# Patient Record
Sex: Female | Born: 1970 | Race: White | Hispanic: No | Marital: Married | Smoking: Former smoker
Health system: Southern US, Community
[De-identification: ages and names within clinical notes are randomized; demographics above are authoritative.]

## PROBLEM LIST (undated history)

## (undated) DIAGNOSIS — C801 Malignant (primary) neoplasm, unspecified: Secondary | ICD-10-CM

## (undated) DIAGNOSIS — Z1211 Encounter for screening for malignant neoplasm of colon: Secondary | ICD-10-CM

## (undated) HISTORY — PX: OTHER SURGICAL HISTORY: SHX169

## (undated) HISTORY — PX: SEPTOPLASTY: SUR1290

## (undated) HISTORY — DX: Malignant (primary) neoplasm, unspecified: C80.1

## (undated) HISTORY — DX: Encounter for screening for malignant neoplasm of colon: Z12.11

---

## 1997-11-17 ENCOUNTER — Inpatient Hospital Stay (HOSPITAL_COMMUNITY): Admission: AD | Admit: 1997-11-17 | Discharge: 1997-11-19 | Payer: Self-pay | Admitting: *Deleted

## 1997-12-29 ENCOUNTER — Other Ambulatory Visit: Admission: RE | Admit: 1997-12-29 | Discharge: 1997-12-29 | Payer: Self-pay | Admitting: *Deleted

## 2002-10-17 ENCOUNTER — Other Ambulatory Visit: Admission: RE | Admit: 2002-10-17 | Discharge: 2002-10-17 | Payer: Self-pay | Admitting: Obstetrics & Gynecology

## 2004-03-27 HISTORY — PX: BREAST BIOPSY: SHX20

## 2005-01-09 ENCOUNTER — Ambulatory Visit: Payer: Self-pay | Admitting: Family Medicine

## 2005-01-17 ENCOUNTER — Ambulatory Visit: Payer: Self-pay | Admitting: Oncology

## 2005-01-20 ENCOUNTER — Ambulatory Visit: Payer: Self-pay | Admitting: Oncology

## 2005-01-22 ENCOUNTER — Encounter: Admission: RE | Admit: 2005-01-22 | Discharge: 2005-01-22 | Payer: Self-pay | Admitting: Oncology

## 2005-01-25 ENCOUNTER — Ambulatory Visit: Payer: Self-pay | Admitting: Oncology

## 2005-02-24 ENCOUNTER — Ambulatory Visit: Payer: Self-pay | Admitting: Oncology

## 2005-03-27 ENCOUNTER — Ambulatory Visit: Payer: Self-pay | Admitting: Oncology

## 2005-03-27 DIAGNOSIS — C801 Malignant (primary) neoplasm, unspecified: Secondary | ICD-10-CM

## 2005-03-27 DIAGNOSIS — Z1211 Encounter for screening for malignant neoplasm of colon: Secondary | ICD-10-CM

## 2005-03-27 HISTORY — PX: COLONOSCOPY: SHX174

## 2005-03-27 HISTORY — DX: Malignant (primary) neoplasm, unspecified: C80.1

## 2005-03-27 HISTORY — DX: Encounter for screening for malignant neoplasm of colon: Z12.11

## 2005-03-27 HISTORY — PX: BREAST LUMPECTOMY: SHX2

## 2005-04-27 ENCOUNTER — Ambulatory Visit: Payer: Self-pay | Admitting: Oncology

## 2005-05-25 ENCOUNTER — Ambulatory Visit: Payer: Self-pay | Admitting: Oncology

## 2005-05-26 ENCOUNTER — Ambulatory Visit: Payer: Self-pay | Admitting: Oncology

## 2005-06-25 ENCOUNTER — Ambulatory Visit: Payer: Self-pay | Admitting: Oncology

## 2005-06-28 ENCOUNTER — Ambulatory Visit: Payer: Self-pay | Admitting: General Surgery

## 2005-07-25 ENCOUNTER — Ambulatory Visit: Payer: Self-pay | Admitting: Oncology

## 2005-08-25 ENCOUNTER — Ambulatory Visit: Payer: Self-pay | Admitting: Oncology

## 2005-09-24 ENCOUNTER — Ambulatory Visit: Payer: Self-pay | Admitting: Oncology

## 2005-10-25 ENCOUNTER — Ambulatory Visit: Payer: Self-pay | Admitting: Oncology

## 2006-01-01 ENCOUNTER — Ambulatory Visit: Payer: Self-pay | Admitting: General Surgery

## 2006-01-03 ENCOUNTER — Ambulatory Visit: Payer: Self-pay | Admitting: General Surgery

## 2006-02-09 ENCOUNTER — Ambulatory Visit: Payer: Self-pay | Admitting: Oncology

## 2006-02-24 ENCOUNTER — Ambulatory Visit: Payer: Self-pay | Admitting: Oncology

## 2006-03-12 ENCOUNTER — Ambulatory Visit: Payer: Self-pay | Admitting: General Surgery

## 2006-06-12 ENCOUNTER — Ambulatory Visit: Payer: Self-pay | Admitting: Oncology

## 2006-06-13 ENCOUNTER — Ambulatory Visit: Payer: Self-pay | Admitting: Oncology

## 2006-06-26 ENCOUNTER — Ambulatory Visit: Payer: Self-pay | Admitting: Oncology

## 2006-08-08 ENCOUNTER — Ambulatory Visit: Payer: Self-pay | Admitting: General Surgery

## 2006-09-19 ENCOUNTER — Ambulatory Visit: Payer: Self-pay | Admitting: Oncology

## 2006-09-25 ENCOUNTER — Ambulatory Visit: Payer: Self-pay | Admitting: Oncology

## 2006-11-01 ENCOUNTER — Ambulatory Visit: Payer: Self-pay | Admitting: Family Medicine

## 2006-12-26 ENCOUNTER — Ambulatory Visit: Payer: Self-pay | Admitting: Oncology

## 2007-01-02 ENCOUNTER — Ambulatory Visit: Payer: Self-pay | Admitting: General Surgery

## 2007-01-11 ENCOUNTER — Ambulatory Visit: Payer: Self-pay | Admitting: Oncology

## 2007-01-26 ENCOUNTER — Ambulatory Visit: Payer: Self-pay | Admitting: Oncology

## 2007-02-15 ENCOUNTER — Ambulatory Visit: Payer: Self-pay | Admitting: Oncology

## 2007-02-25 ENCOUNTER — Ambulatory Visit: Payer: Self-pay | Admitting: Oncology

## 2007-05-30 ENCOUNTER — Ambulatory Visit: Payer: Self-pay | Admitting: Oncology

## 2007-06-26 ENCOUNTER — Ambulatory Visit: Payer: Self-pay | Admitting: Oncology

## 2007-07-04 ENCOUNTER — Ambulatory Visit: Payer: Self-pay | Admitting: Oncology

## 2007-07-26 ENCOUNTER — Ambulatory Visit: Payer: Self-pay | Admitting: Oncology

## 2007-12-26 ENCOUNTER — Ambulatory Visit: Payer: Self-pay | Admitting: Oncology

## 2008-01-06 ENCOUNTER — Ambulatory Visit: Payer: Self-pay | Admitting: General Surgery

## 2008-05-21 ENCOUNTER — Ambulatory Visit: Payer: Self-pay | Admitting: Family Medicine

## 2009-04-29 ENCOUNTER — Ambulatory Visit: Payer: Self-pay | Admitting: General Surgery

## 2010-05-03 ENCOUNTER — Ambulatory Visit: Payer: Self-pay | Admitting: General Surgery

## 2010-05-05 ENCOUNTER — Ambulatory Visit: Payer: Self-pay | Admitting: General Surgery

## 2010-05-20 ENCOUNTER — Other Ambulatory Visit: Payer: Self-pay | Admitting: *Deleted

## 2010-05-20 DIAGNOSIS — Z9889 Other specified postprocedural states: Secondary | ICD-10-CM

## 2010-05-26 ENCOUNTER — Other Ambulatory Visit: Payer: Self-pay | Admitting: General Surgery

## 2010-05-26 DIAGNOSIS — Z9889 Other specified postprocedural states: Secondary | ICD-10-CM

## 2010-07-07 ENCOUNTER — Other Ambulatory Visit: Payer: Self-pay | Admitting: General Surgery

## 2010-07-07 DIAGNOSIS — Z9889 Other specified postprocedural states: Secondary | ICD-10-CM

## 2010-07-08 ENCOUNTER — Ambulatory Visit
Admission: RE | Admit: 2010-07-08 | Discharge: 2010-07-08 | Disposition: A | Discharge status: Home / Self Care | Payer: BC Managed Care – PPO | Source: Ambulatory Visit | Attending: *Deleted | Admitting: *Deleted

## 2010-07-08 DIAGNOSIS — Z9889 Other specified postprocedural states: Secondary | ICD-10-CM

## 2010-07-08 MED ORDER — GADOBENATE DIMEGLUMINE 529 MG/ML IV SOLN
14.0000 mL | Freq: Once | INTRAVENOUS | Status: AC | PRN
  Administered 2010-07-08: 14 mL via INTRAVENOUS

## 2011-05-03 ENCOUNTER — Ambulatory Visit: Payer: Self-pay | Admitting: Oncology

## 2011-05-16 LAB — CBC CANCER CENTER
Basophil #: 0 x10 3/mm (ref 0.0–0.1)
Basophil %: 0.6 %
Eosinophil #: 0 x10 3/mm (ref 0.0–0.7)
Eosinophil %: 1 %
HCT: 39.8 % (ref 35.0–47.0)
HGB: 13.6 g/dL (ref 12.0–16.0)
Lymphocyte #: 1.2 x10 3/mm (ref 1.0–3.6)
Lymphocyte %: 26.1 %
MCH: 30.5 pg (ref 26.0–34.0)
MCHC: 34.2 g/dL (ref 32.0–36.0)
MCV: 89 fL (ref 80–100)
Monocyte #: 0.4 x10 3/mm (ref 0.0–0.7)
Monocyte %: 8 %
Neutrophil #: 3.1 x10 3/mm (ref 1.4–6.5)
Neutrophil %: 64.3 %
Platelet: 283 x10 3/mm (ref 150–440)
RBC: 4.47 10*6/uL (ref 3.80–5.20)
RDW: 13.2 % (ref 11.5–14.5)
WBC: 4.8 x10 3/mm (ref 3.6–11.0)

## 2011-05-25 ENCOUNTER — Ambulatory Visit: Payer: Self-pay | Admitting: General Surgery

## 2011-05-26 ENCOUNTER — Ambulatory Visit: Payer: Self-pay | Admitting: Oncology

## 2011-11-09 ENCOUNTER — Ambulatory Visit: Payer: Self-pay | Admitting: Oncology

## 2011-11-10 LAB — CBC CANCER CENTER
Basophil #: 0 x10 3/mm (ref 0.0–0.1)
Basophil %: 0.5 %
Eosinophil #: 0 x10 3/mm (ref 0.0–0.7)
Eosinophil %: 0.6 %
HCT: 36.9 % (ref 35.0–47.0)
HGB: 12.2 g/dL (ref 12.0–16.0)
Lymphocyte #: 1.4 x10 3/mm (ref 1.0–3.6)
Lymphocyte %: 18.5 %
MCH: 30.1 pg (ref 26.0–34.0)
MCHC: 33 g/dL (ref 32.0–36.0)
MCV: 91 fL (ref 80–100)
Monocyte #: 0.5 x10 3/mm (ref 0.2–0.9)
Monocyte %: 7.2 %
Neutrophil %: 73.2 %
RDW: 13 % (ref 11.5–14.5)
WBC: 7.3 x10 3/mm (ref 3.6–11.0)

## 2011-11-26 ENCOUNTER — Ambulatory Visit: Payer: Self-pay | Admitting: Oncology

## 2012-05-05 ENCOUNTER — Encounter: Payer: Self-pay | Admitting: *Deleted

## 2012-05-05 DIAGNOSIS — Z1211 Encounter for screening for malignant neoplasm of colon: Secondary | ICD-10-CM | POA: Insufficient documentation

## 2012-05-05 DIAGNOSIS — C801 Malignant (primary) neoplasm, unspecified: Secondary | ICD-10-CM | POA: Insufficient documentation

## 2012-06-20 ENCOUNTER — Encounter: Payer: Self-pay | Admitting: *Deleted

## 2012-06-20 NOTE — Progress Notes (Signed)
Made in error

## 2012-11-08 IMAGING — MG MAM DGTL DIAGNOSTIC MAMMO W/CAD
1 series · 6 of 6 positions shown · non-contrast
Comparison: none

REASON FOR EXAM: HX BR CA  YRLY
COMMENTS:

[R CC · right · 6 of 6 slices shown]
[im 1/6]
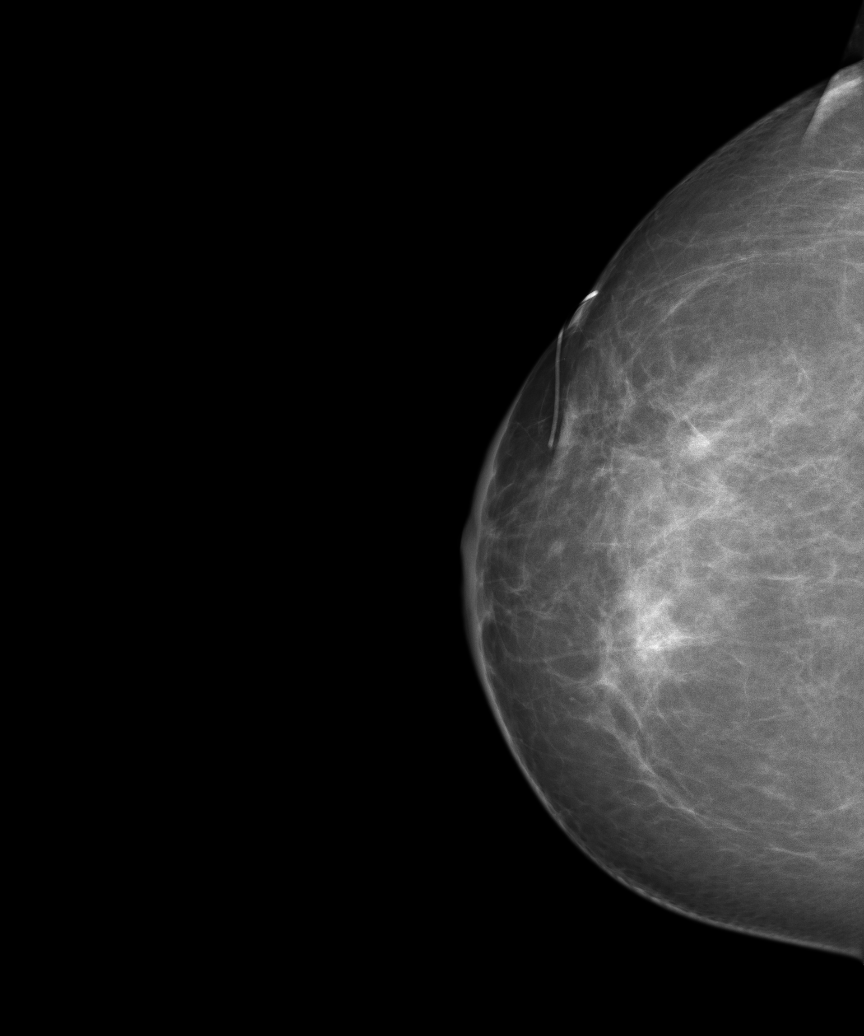
[im 2/6]
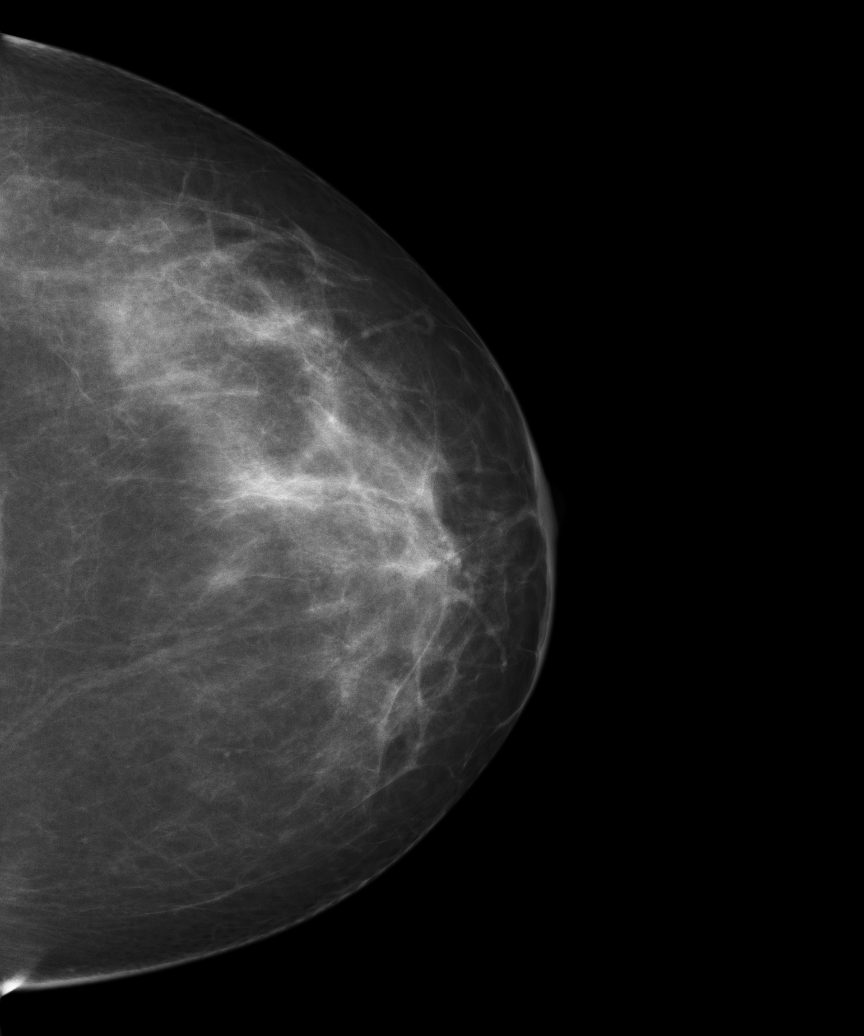
[im 3/6]
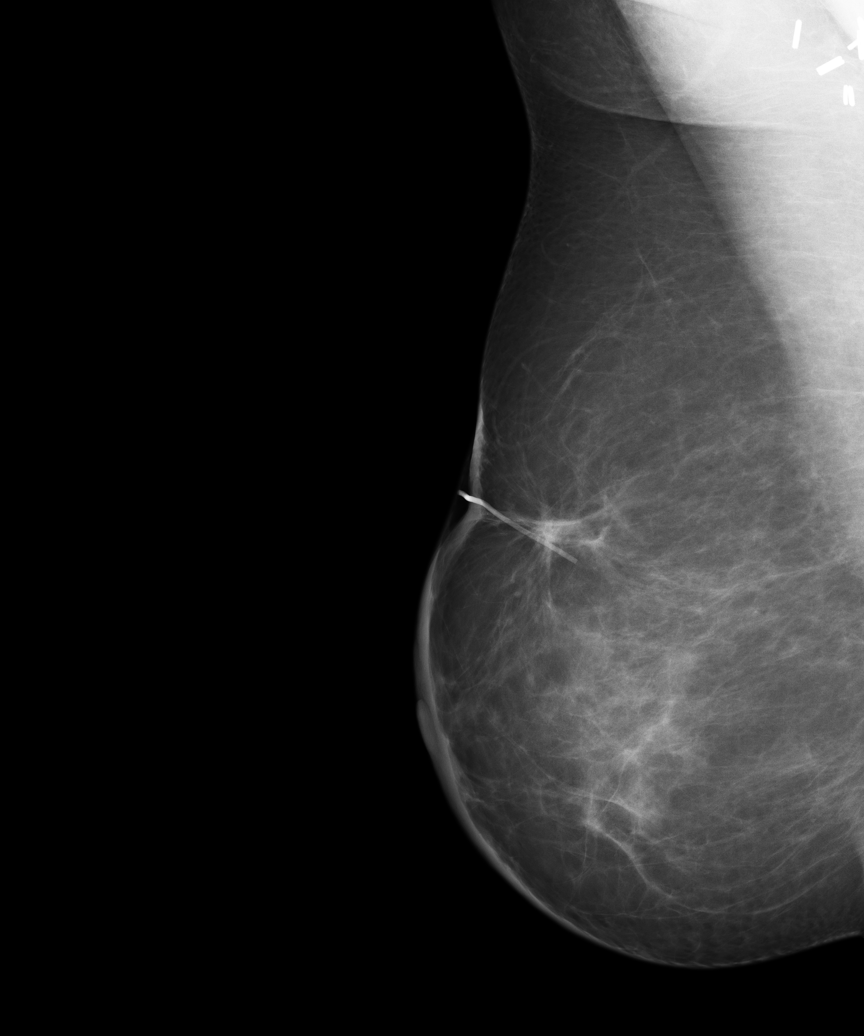
[im 4/6]
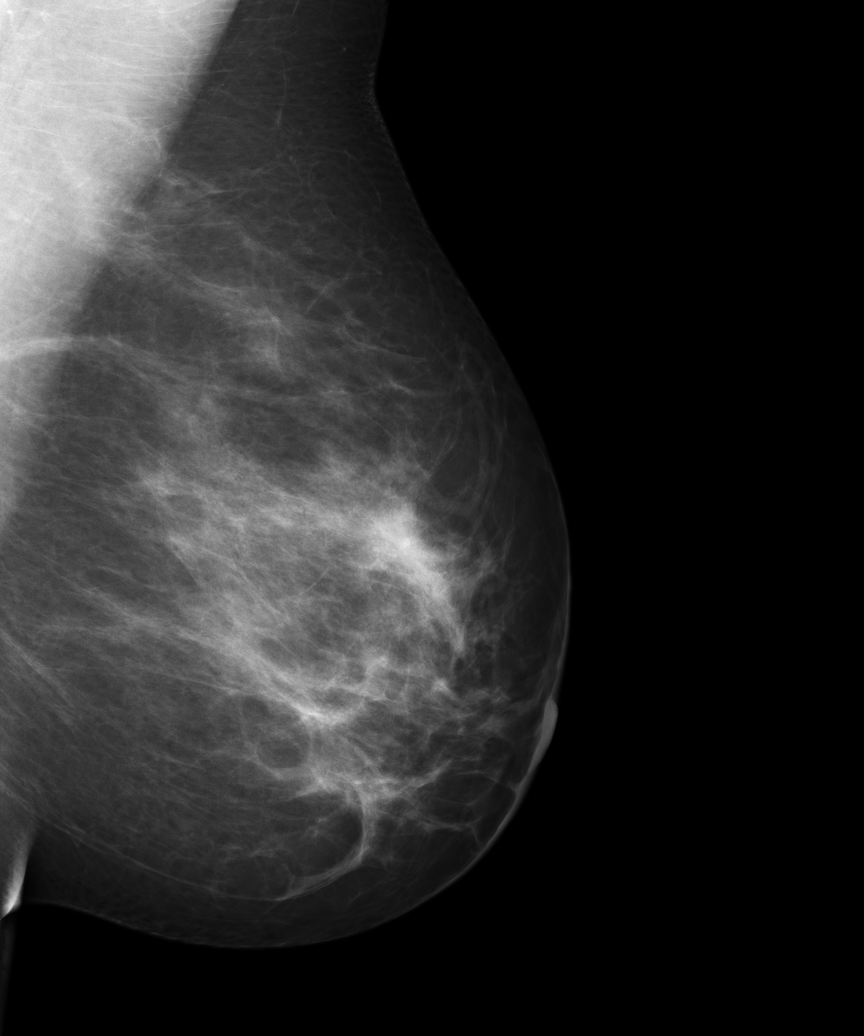
[im 5/6]
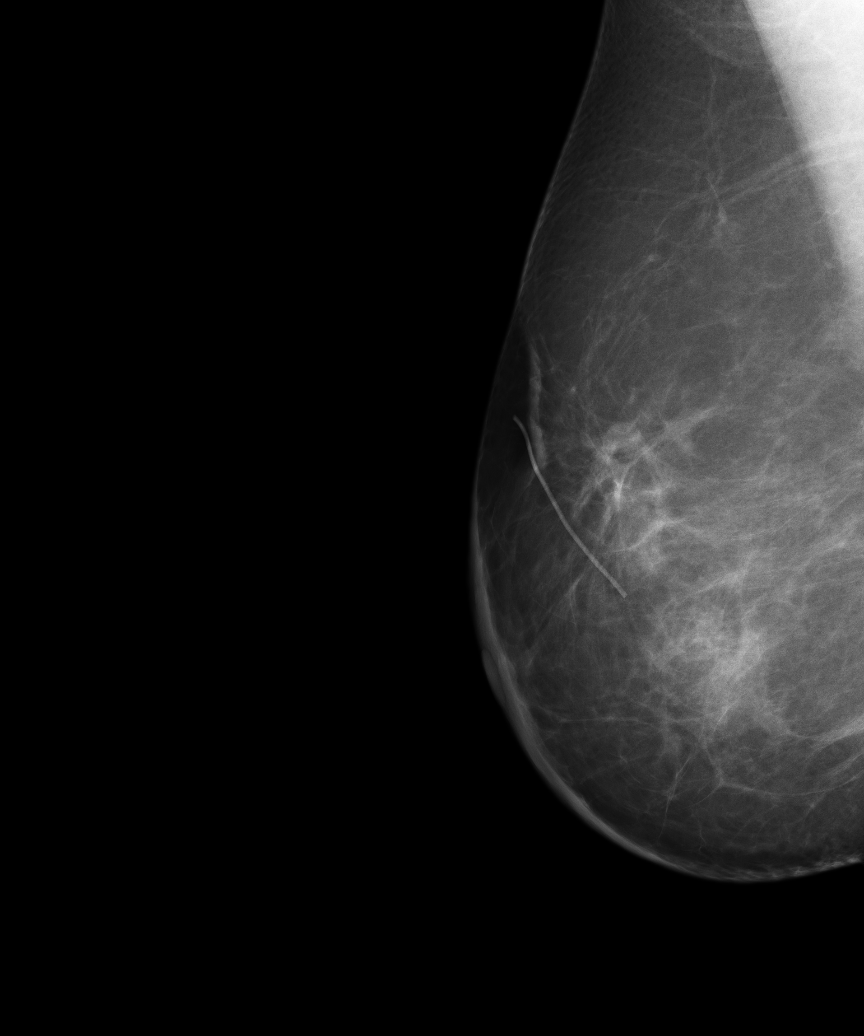
[im 6/6]
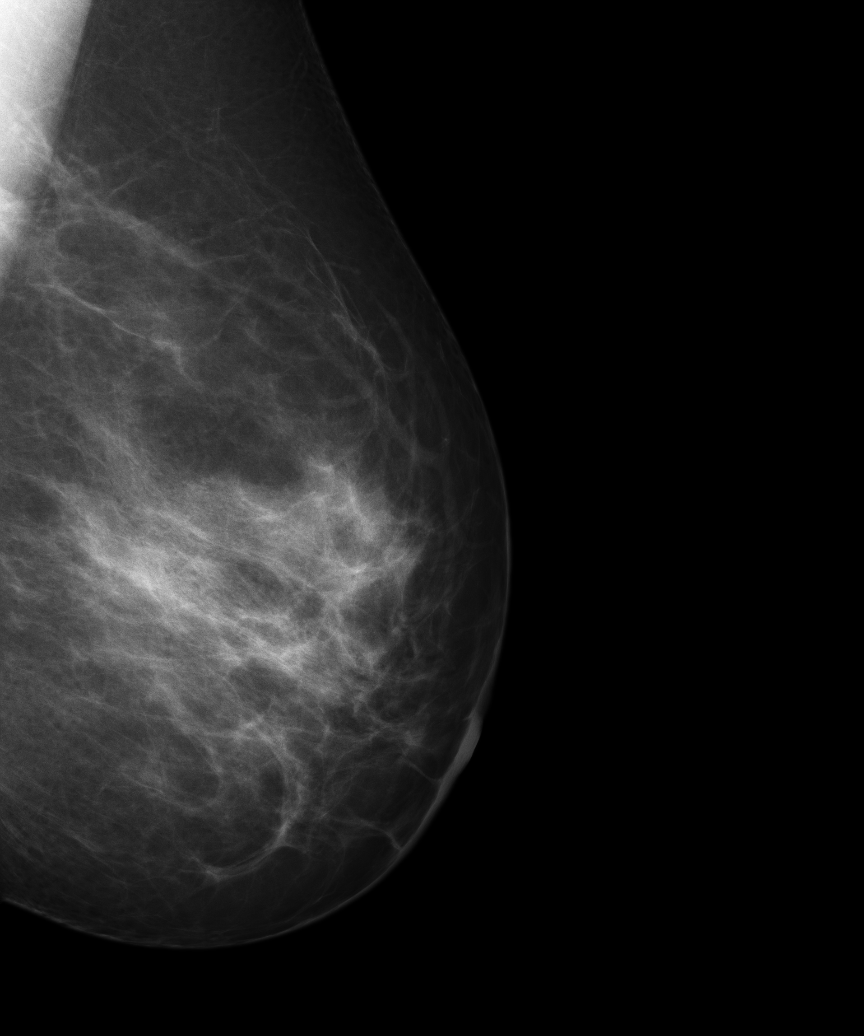

[6 of 6 positions shown; findings below may reference images not displayed]

PROCEDURE:     MAM - MAM DGTL DIAGNOSTIC MAMMO W/CAD  - May 03, 2010  [DATE]

RESULT:     The patient has a history of right breast lumpectomy with a
history of right breast cancer treated with chemotherapy and radiation
therapy in 8557 and 9886. There is a scar marker in the upper outer mid to
posterior portion of the right breast which is unchanged compared to digital
images of 04/29/2009, as well as 01/06/2008. The breasts exhibit a mild to
moderately dense parenchymal pattern. The breast parenchymal pattern appears
to be slightly more prominent on the left compared to the previous studies.
There is an area of density in the upper central left breast which appears
to be slightly more prominent. Additional magnification compression images
are recommended. The right breast appears unchanged.
IMPRESSION: BI-RADS: Category 0 - Needs Addtional Imaging Evaluation

Please have the patient return for additional magnification compression
images of the left breast to better evaluate an area of slightly increased
parenchymal density with a second area marked on the CC view more laterally.

A NEGATIVE MAMMOGRAM REPORT DOES NOT PRECLUDE BIOPSY OR OTHER EVALUATION OF
A CLINICALLY PALPABLE OR OTHERWISE SUSPICIOUS MASS OR LESION. BREAST CANCER
MAY NOT BE DETECTED BY MAMMOGRAPHY IN UP TO 10% OF CASES.

## 2012-11-10 IMAGING — MG MAM DGTL [HOSPITAL] ADD VIEWS LT DIAG
1 series · 4 of 4 positions shown · non-contrast
Comparison: none

REASON FOR EXAM: AV LT DENSITY
COMMENTS:

[L ML · left · 4 of 4 slices shown]
[im 1/4]
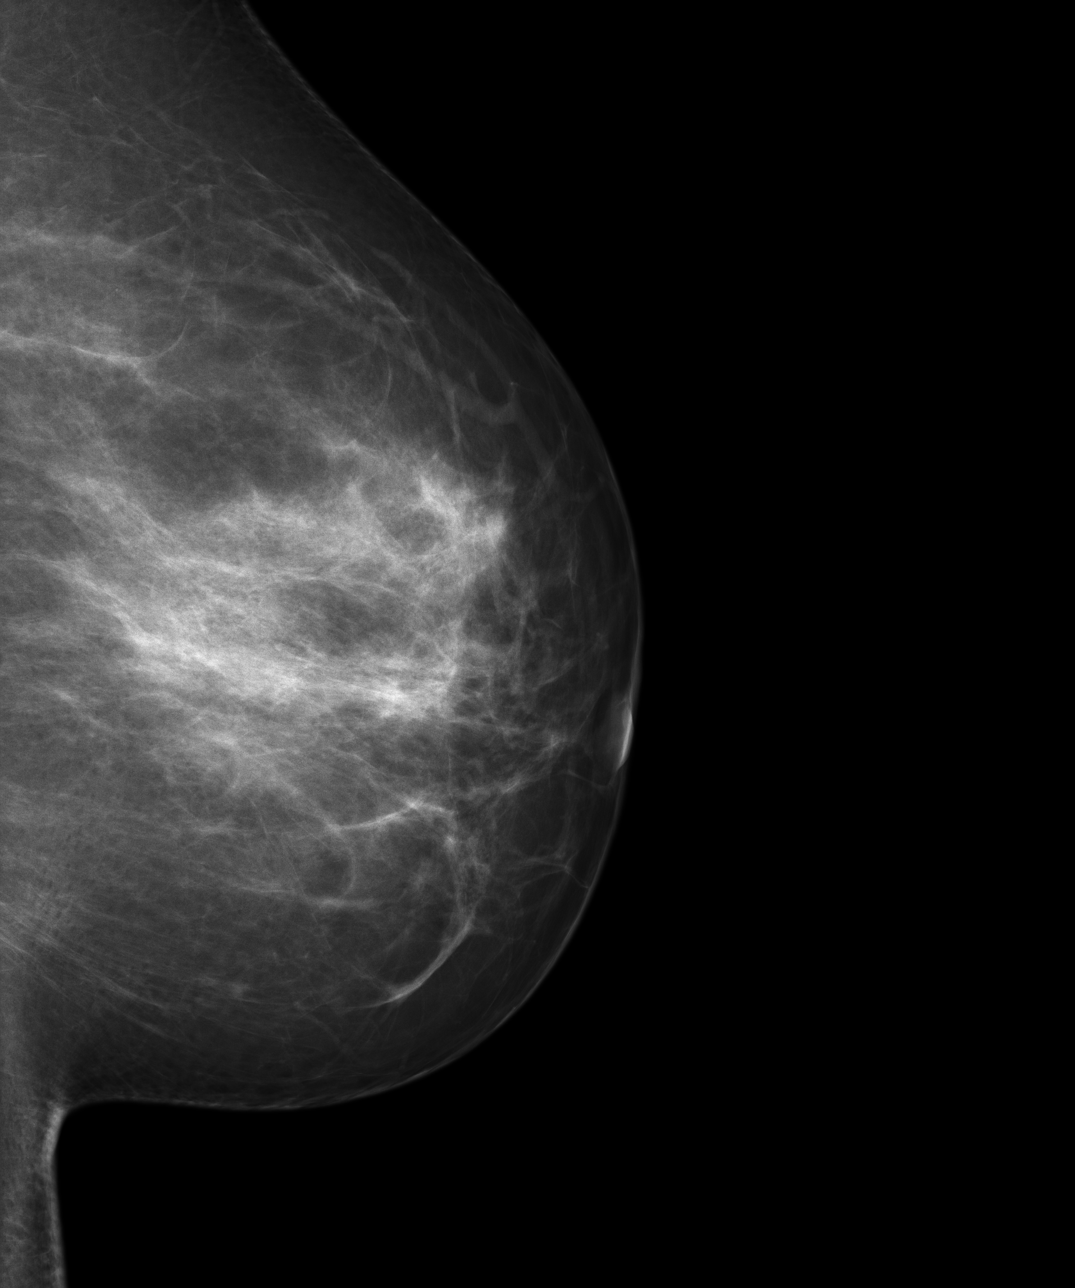
[im 2/4]
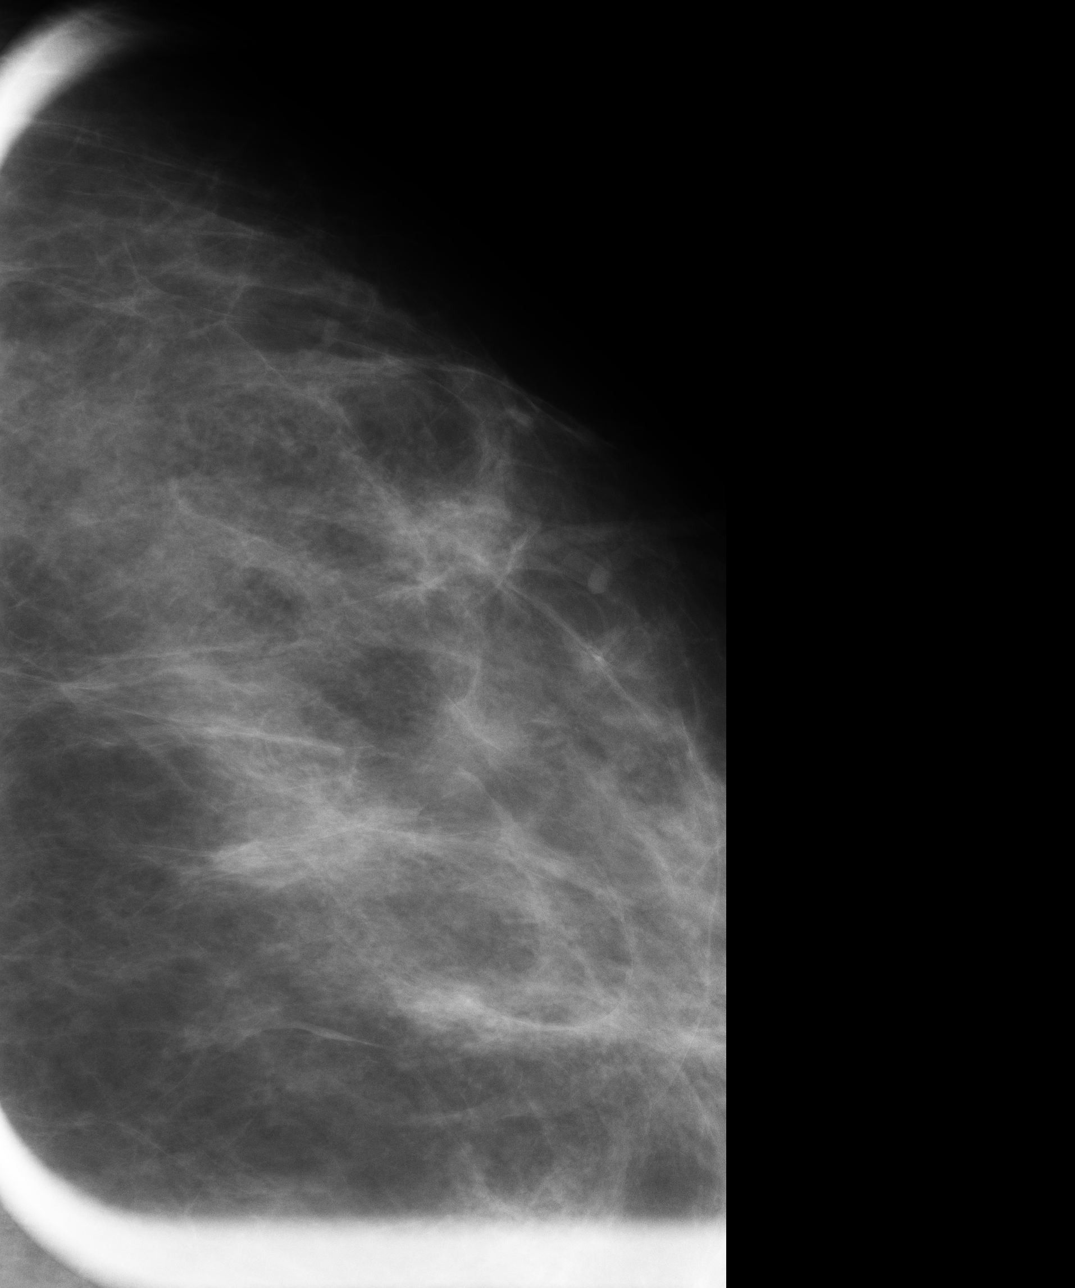
[im 3/4]
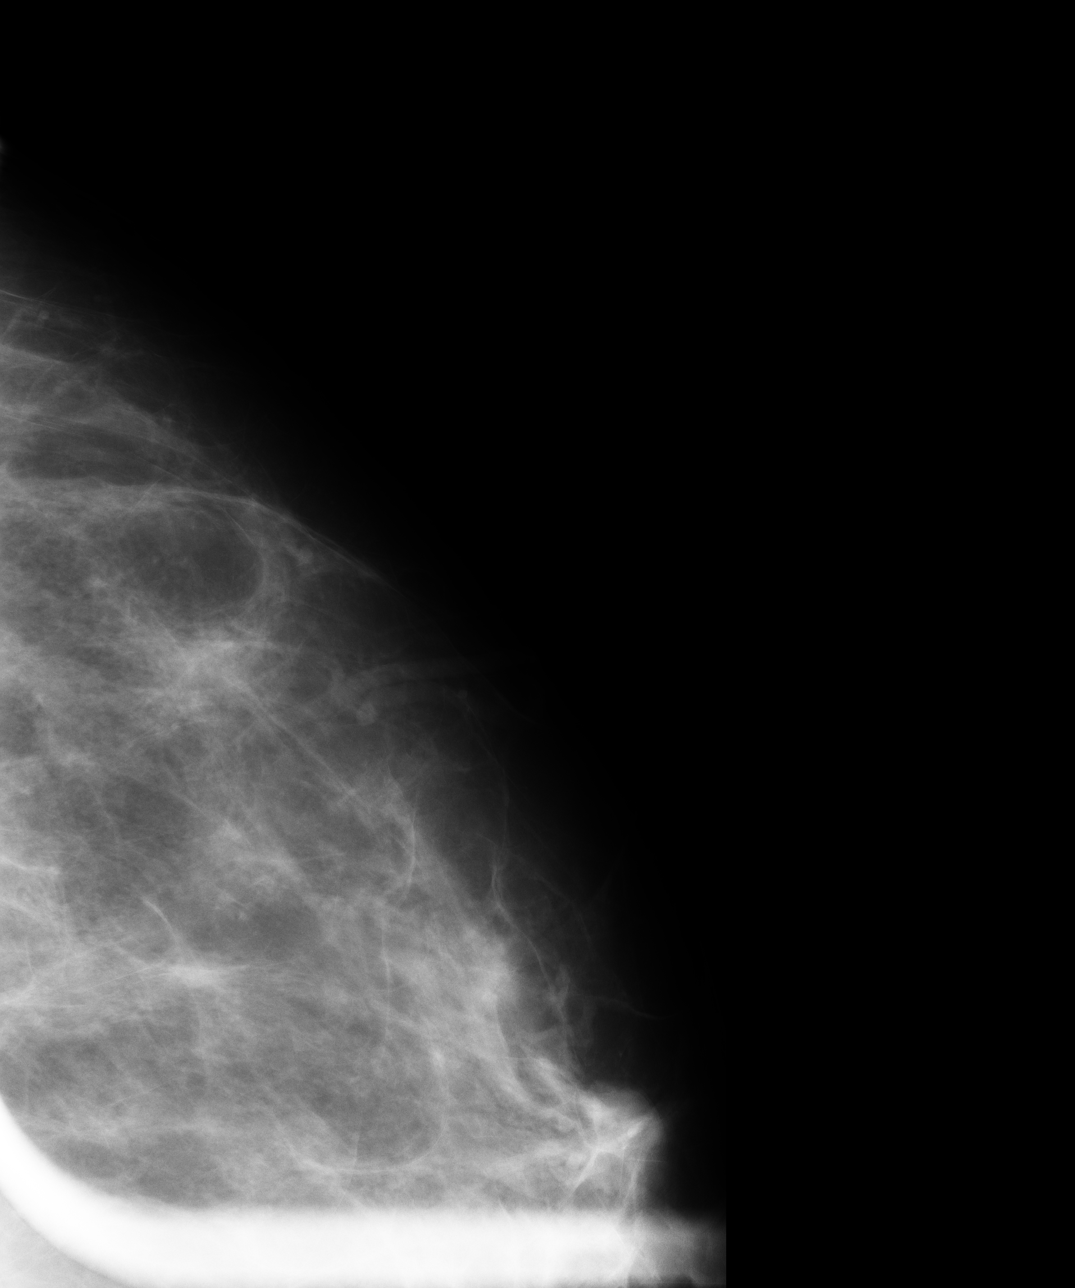
[im 4/4]
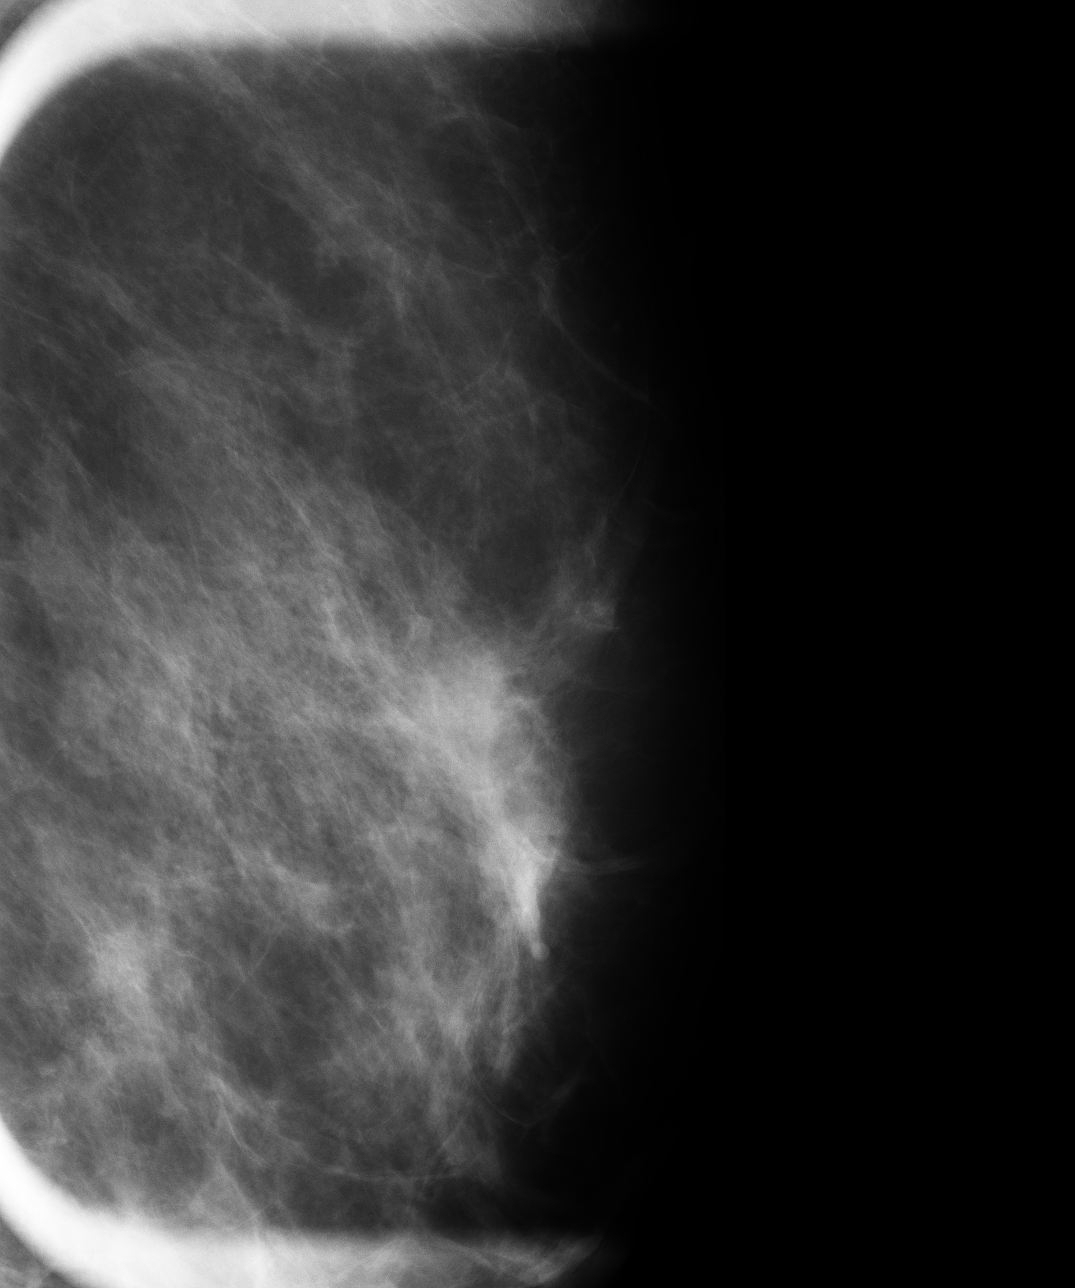

[4 of 4 positions shown; findings below may reference images not displayed]

PROCEDURE:     MAM - MAM DGTL [HOSPITAL] ADD VIEWS LT DIAG  - May 05, 2010 [DATE]

RESULT:     The patient returned for additional magnification compression
images of the left breast in the areas of concern. The additional
magnification compression images show the area of nodular density in the
upper outer mid to posterior left breast resolves. No further follow-up is
necessary aside from annual screening mammogram.
IMPRESSION: Amended BI-RADS: Category 2 - Benign Findings

RECOMMENDATIONS:
1.     Please continue to encourage yearly mammographic follow-up.

A NEGATIVE MAMMOGRAM REPORT DOES NOT PRECLUDE BIOPSY OR OTHER EVALUATION OF
A CLINICALLY PALPABLE OR OTHERWISE SUSPICIOUS MASS OR LESION. BREAST CA[HOSPITAL]ER
MAY NOT BE DETECTED BY MAMMOGRAPHY IN UP TO 10% OF CASES.

## 2013-11-30 IMAGING — MG MAM DGTL DIAGNOSTIC MAMMO W/CAD
1 series · 6 of 6 positions shown · non-contrast
Comparison: none

REASON FOR EXAM: hx brst ca
COMMENTS:

[Series 9839: R CC · right · 6 of 6 slices shown]
[im 1/6]
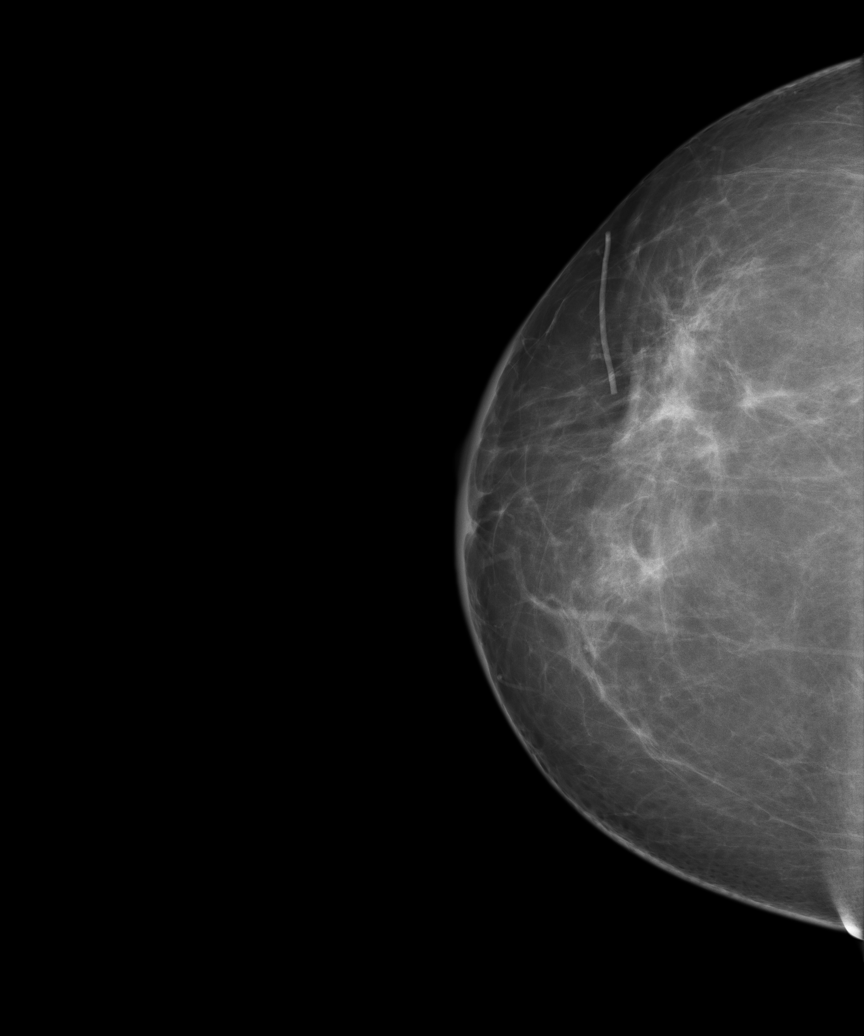
[im 2/6]
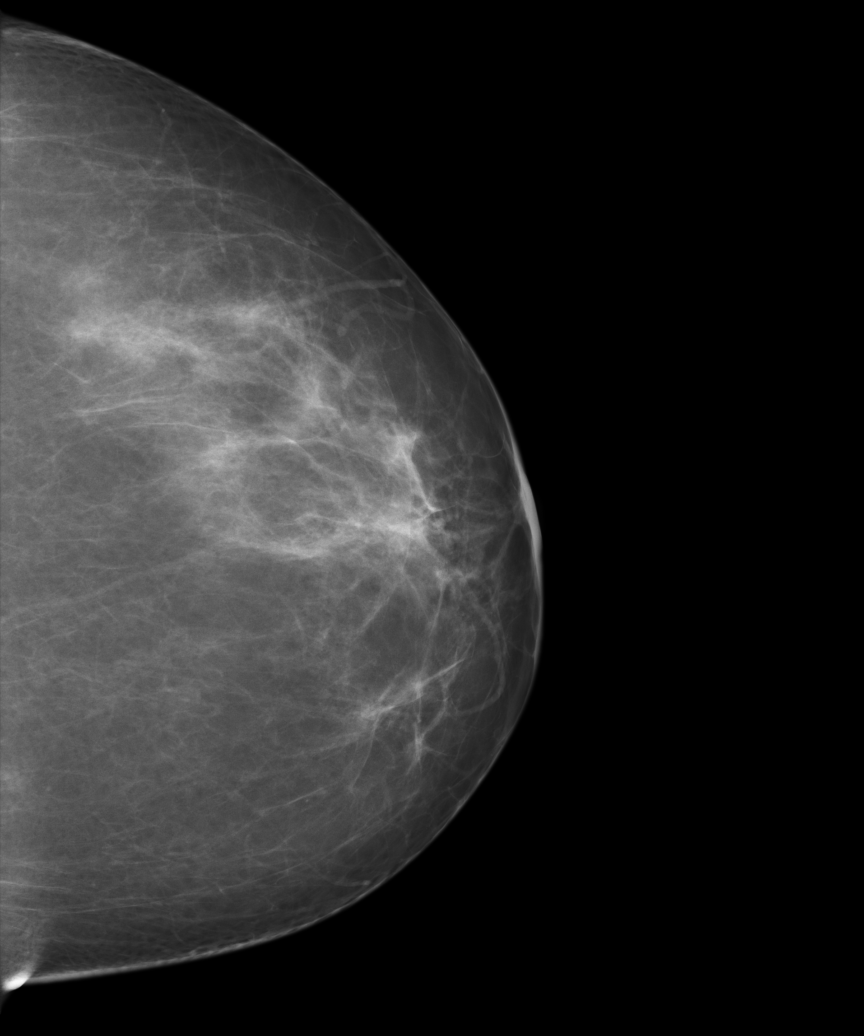
[im 3/6]
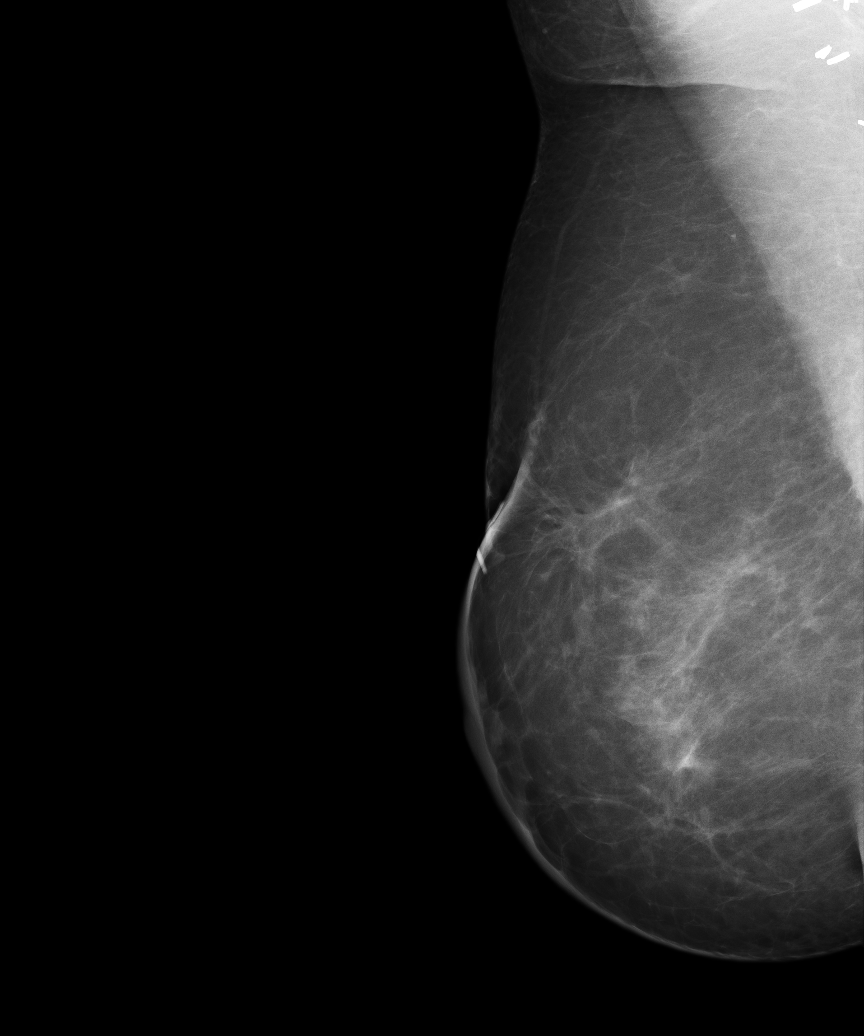
[im 4/6]
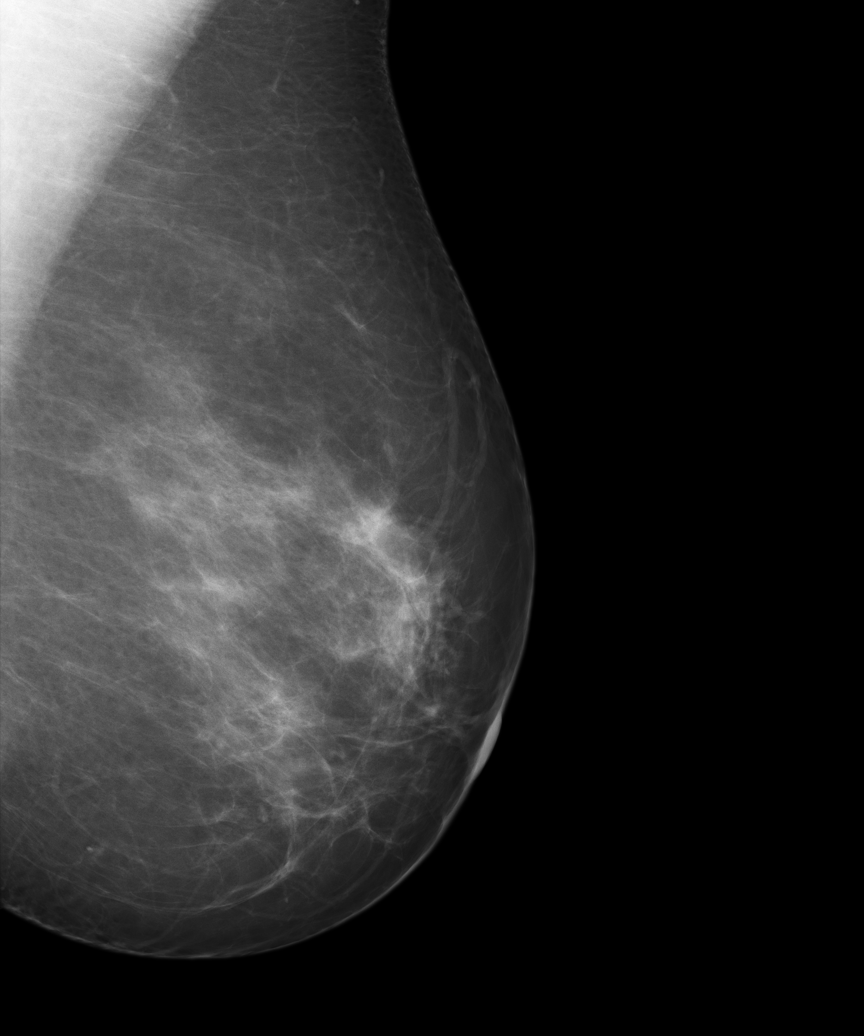
[im 5/6]
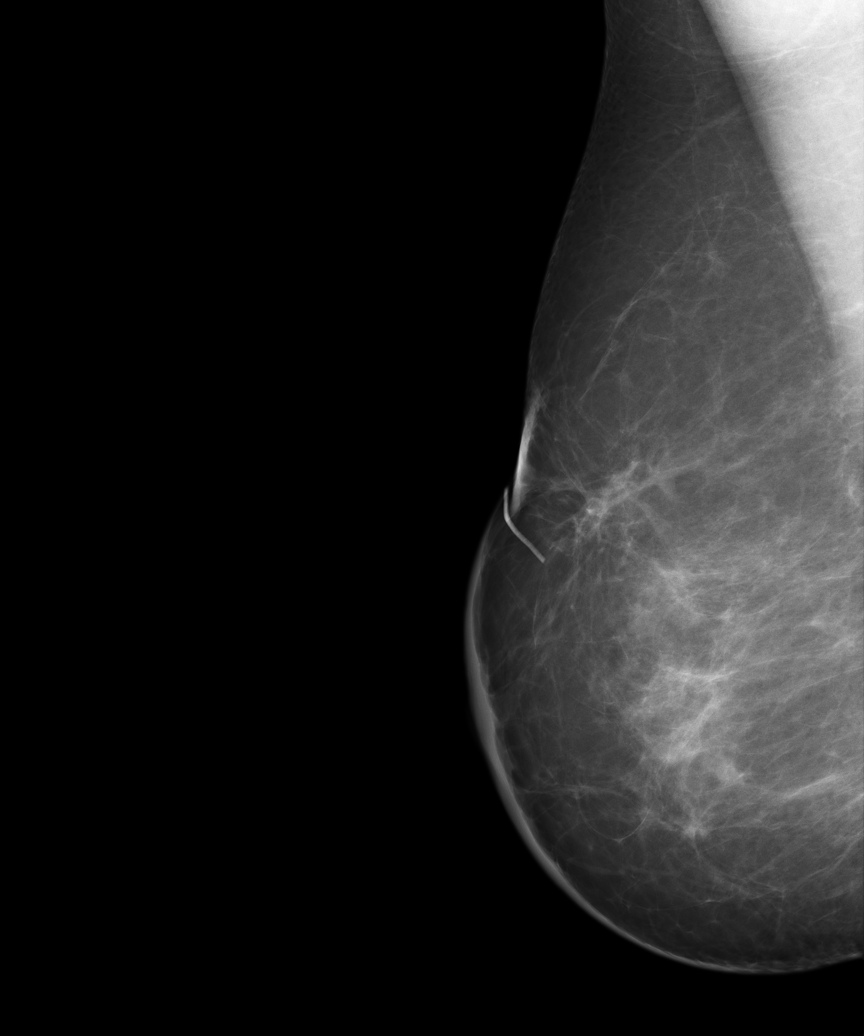
[im 6/6]
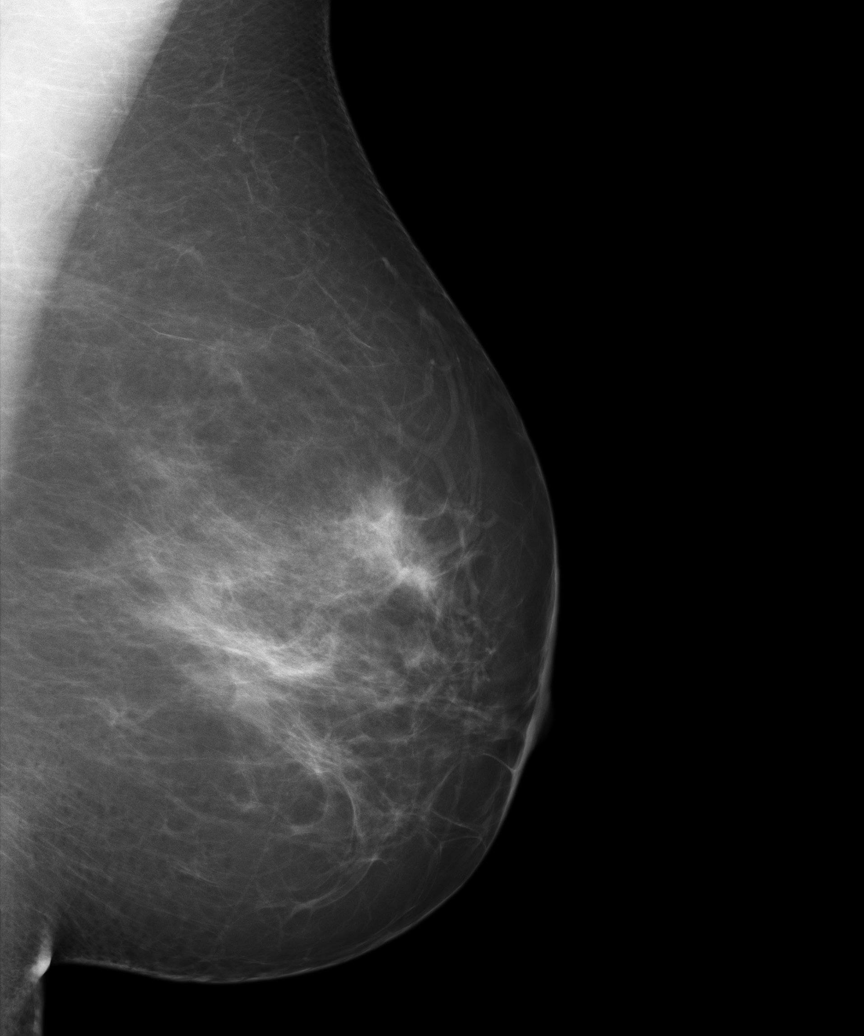

[6 of 6 positions shown; findings below may reference images not displayed]

PROCEDURE:     MAM - MAM DGTL DIAGNOSTIC MAMMO W/CAD  - May 25, 2011  [DATE]

RESULT:     The patient has history of right breast carcinoma treated with
lumpectomy, chemotherapy and radiation. The current exam is compared to
prior examinations May 03, 2010,April 29, 2009 and January 02, 2007.

Postsurgical and postradiation changes are again noted on the right. No mass
or malignant-appearing microcalcifications are identified in either breast.
The breast parenchymal pattern bilaterally is that of scattered
fibroglandular elements.
IMPRESSION: 1. Bilaterally benign-appearing mammography.
2. Continued annual mammography is recommended.
3. BI-RADS:  Category 2- Benign Finding.

A negative mammogram report does not preclude biopsy or other evaluation of
a clinically palpable or otherwise suspicious mass or lesion.  Breast cancer
may not be detected by mammography in up to 10% of cases.

## 2014-01-26 ENCOUNTER — Encounter: Payer: Self-pay | Admitting: *Deleted

## 2021-09-29 ENCOUNTER — Other Ambulatory Visit: Payer: Self-pay | Admitting: Obstetrics and Gynecology

## 2021-09-29 DIAGNOSIS — Z853 Personal history of malignant neoplasm of breast: Secondary | ICD-10-CM

## 2021-10-11 ENCOUNTER — Ambulatory Visit
Admission: RE | Admit: 2021-10-11 | Discharge: 2021-10-11 | Disposition: A | Discharge status: Home / Self Care | Payer: BC Managed Care – PPO | Source: Ambulatory Visit | Attending: Obstetrics and Gynecology | Admitting: Obstetrics and Gynecology

## 2021-10-11 DIAGNOSIS — Z853 Personal history of malignant neoplasm of breast: Secondary | ICD-10-CM

## 2021-10-11 MED ORDER — GADOBUTROL 1 MMOL/ML IV SOLN
6.0000 mL | Freq: Once | INTRAVENOUS | Status: AC | PRN
  Administered 2021-10-11: 6 mL via INTRAVENOUS

## 2021-12-30 ENCOUNTER — Ambulatory Visit (AMBULATORY_SURGERY_CENTER): Payer: Self-pay

## 2021-12-30 VITALS — Ht 64.0 in | Wt 146.0 lb

## 2021-12-30 DIAGNOSIS — Z1211 Encounter for screening for malignant neoplasm of colon: Secondary | ICD-10-CM

## 2021-12-30 MED ORDER — NA SULFATE-K SULFATE-MG SULF 17.5-3.13-1.6 GM/177ML PO SOLN: 1.0000 | ORAL | 0 refills | Status: DC

## 2021-12-30 NOTE — Progress Notes (Signed)
No egg or soy allergy known to patient  No issues known to pt with past sedation with any surgeries or procedures Patient denies ever being told they had issues or difficulty with intubation  No FH of Malignant Hyperthermia Pt is not on diet pills Pt is not on  home 02  Pt is not on blood thinners  Pt denies issues with constipation  No A fib or A flutter Have any cardiac testing pending--denied Pt instructed to use Single[employer] or GoodRx for a price reduction on prep   

## 2022-01-11 ENCOUNTER — Encounter: Payer: Self-pay | Admitting: Gastroenterology

## 2022-01-20 ENCOUNTER — Ambulatory Visit (AMBULATORY_SURGERY_CENTER): Payer: BC Managed Care – PPO | Admitting: Gastroenterology

## 2022-01-20 ENCOUNTER — Encounter: Payer: Self-pay | Admitting: Gastroenterology

## 2022-01-20 VITALS — BP 135/72 | HR 62 | Temp 97.5°F | Resp 12 | Ht 64.0 in | Wt 146.0 lb

## 2022-01-20 DIAGNOSIS — Z1211 Encounter for screening for malignant neoplasm of colon: Secondary | ICD-10-CM | POA: Diagnosis present

## 2022-01-20 DIAGNOSIS — D122 Benign neoplasm of ascending colon: Secondary | ICD-10-CM

## 2022-01-20 DIAGNOSIS — Z800 Family history of malignant neoplasm of digestive organs: Secondary | ICD-10-CM

## 2022-01-20 MED ORDER — SODIUM CHLORIDE 0.9 % IV SOLN: 500.0000 mL | INTRAVENOUS | Status: DC

## 2022-01-20 NOTE — Op Note (Addendum)
[facility] Patient Name: Chelsea Palmer Procedure Date: 01/20/2022 9:46 AM MRN: [id number] Endoscopist: Thornton Park MD, MD, [id number] Age: 51 Referring MD:  Date of Birth: 1971/02/08 Gender: Female Account #: [id number] Procedure:                Colonoscopy Indications:              Screening for colorectal malignant neoplasm                           Brother with colon cancer at age 66 Medicines:                Monitored Anesthesia Care Procedure:                Pre-Anesthesia Assessment:                           - Prior to the procedure, a History and Physical                            was performed, and patient medications and                            allergies were reviewed. The patient's tolerance of                            previous anesthesia was also reviewed. The risks                            and benefits of the procedure and the sedation                            options and risks were discussed with the patient.                            All questions were answered, and informed consent                            was obtained. Prior Anticoagulants: The patient has                            taken no anticoagulant or antiplatelet agents. ASA                            Grade Assessment: II - A patient with mild systemic                            disease. After reviewing the risks and benefits,                            the patient was deemed in satisfactory condition to                            undergo the procedure.  After obtaining informed consent, the colonoscope                            was passed under direct vision. Throughout the                            procedure, the patient's blood pressure, pulse, and                            oxygen saturations were monitored continuously. The                            Olympus CF-HQ190L [id number]) Colonoscope was                            introduced through the anus  and advanced to the 3                            cm into the ileum. A second forward view of the                            right colon was performed. The colonoscopy was                            performed without difficulty. The patient tolerated                            the procedure well. The quality of the bowel                            preparation was good. The terminal ileum, ileocecal                            valve, appendiceal orifice, and rectum were                            photographed. Scope In: 9:52:01 AM Scope Out: 10:05:41 AM Scope Withdrawal Time: 0 hours 9 minutes 35 seconds  Total Procedure Duration: 0 hours 13 minutes 40 seconds  Findings:                 The perianal and digital rectal examinations were                            normal.                           A few large-mouthed, medium-mouthed and                            small-mouthed diverticula were found in the sigmoid                            colon, descending colon, transverse colon and  ascending colon.                           A 4-5 mm polyp was found in the distal ascending                            colon. The polyp was flat. The polyp was removed                            with a cold snare. Resection and retrieval were                            complete. Estimated blood loss was minimal.                           The exam was otherwise without abnormality on                            direct and retroflexion views. Complications:            No immediate complications. Estimated Blood Loss:     Estimated blood loss was minimal. Impression:               - Diverticulosis.                           - One 4-5 mm polyp in the distal ascending colon,                            removed with a cold snare. Resected and retrieved.                           - The examination was otherwise normal on direct                            and retroflexion  views. Recommendation:           - Patient has a contact number available for                            emergencies. The signs and symptoms of potential                            delayed complications were discussed with the                            patient. Return to normal activities tomorrow.                            Written discharge instructions were provided to the                            patient.                           - Continue present medications.                           -  Await pathology results.                           - Repeat colonoscopy in 5 years for surveillance                            regardless of pathology results given the family                            history.                           - Follow a high fiber diet. Drink at least 64                            ounces of water daily. Add a daily stool bulking                            agent such as psyllium (an exampled would be                            Metamucil).                           - Emerging evidence supports eating a diet of                            fruits, vegetables, grains, calcium, and yogurt                            while reducing red meat and alcohol may reduce the                            risk of colon cancer.                           - Thank you for allowing me to be involved in your                            colon cancer prevention. Thornton Park MD, MD 01/20/2022 10:10:50 AM This report has been signed electronically.

## 2022-01-20 NOTE — Patient Instructions (Signed)
-handout on polyps and diverticulosis provided  - Patient has a contact number available for emergencies. The signs and symptoms of potential delayed complications were discussed with the patient. Return to normal activities tomorrow. Written discharge instructions were provided to the patient. - Continue present medications. - Await pathology results. - Repeat colonoscopy in 5 years for surveillance regardless of pathology results given the family history. - Follow a high fiber diet. Drink at least 64 ounces of water daily. Add a daily stool bulking agent such as psyllium (an exampled would be Metamucil). - Emerging evidence supports eating a diet of fruits, vegetables, grains, calcium, and yogurt while reducing red meat and alcohol may reduce the risk of colon cancer. - Thank you for allowing me to be involved in your colon cancer prevention.  YOU HAD AN ENDOSCOPIC PROCEDURE TODAY AT [facility]:   Refer to the procedure report that was given to you for any specific questions about what was found during the examination.  If the procedure report does not answer your questions, please call your gastroenterologist to clarify.  If you requested that your care partner not be given the details of your procedure findings, then the procedure report has been included in a sealed envelope for you to review at your convenience later.  YOU SHOULD EXPECT: Some feelings of bloating in the abdomen. Passage of more gas than usual.  Walking can help get rid of the air that was put into your GI tract during the procedure and reduce the bloating. If you had a lower endoscopy (such as a colonoscopy or flexible sigmoidoscopy) you may notice spotting of blood in your stool or on the toilet paper. If you underwent a bowel prep for your procedure, you may not have a normal bowel movement for a few days.  Please Note:  You might notice some irritation and congestion in your nose or some drainage.  This is  from the oxygen used during your procedure.  There is no need for concern and it should clear up in a day or so.  SYMPTOMS TO REPORT IMMEDIATELY:  Following lower endoscopy (colonoscopy or flexible sigmoidoscopy):  Excessive amounts of blood in the stool  Significant tenderness or worsening of abdominal pains  Swelling of the abdomen that is new, acute  Fever of 100F or higher  For urgent or emergent issues, a gastroenterologist can be reached at any hour by calling [phone]. Do not use MyChart messaging for urgent concerns.    DIET:  We do recommend a small meal at first, but then you may proceed to your regular diet.  Drink plenty of fluids but you should avoid alcoholic beverages for 24 hours.  ACTIVITY:  You should plan to take it easy for the rest of today and you should NOT DRIVE or use heavy machinery until tomorrow (because of the sedation medicines used during the test).    FOLLOW UP: Our staff will call the number listed on your records the next business day following your procedure.  We will call around 7:15- 8:00 am to check on you and address any questions or concerns that you may have regarding the information given to you following your procedure. If we do not reach you, we will leave a message.     If any biopsies were taken you will be contacted by phone or by letter within the next 1-3 weeks.  Please call us at [phone] if you have not heard about the biopsies in 3  weeks.    SIGNATURES/CONFIDENTIALITY: You and/or your care partner have signed paperwork which will be entered into your electronic medical record.  These signatures attest to the fact that that the information above on your After Visit Summary has been reviewed and is understood.  Full responsibility of the confidentiality of this discharge information lies with you and/or your care-partner.

## 2022-01-20 NOTE — Progress Notes (Signed)
A and O x3. Report to RN. Tolerated MAC anesthesia well. 

## 2022-01-20 NOTE — Progress Notes (Signed)
   Referring Provider: Avon Gully, NP Primary Care Physician:  Avon Gully, NP   Indication for Colonoscopy:  Colon cancer screening   IMPRESSION:  Need for colon cancer screening Appropriate candidate for monitored anesthesia care  PLAN: Colonoscopy in the [facility] today   HPI: Chelsea Palmer is a 51 y.o. female presents for screening colonoscopy.  Prior colonoscopy with Dr. Jamal Collin in 2007.  Brother with colon cancer at age 53. No other known family history of colon cancer or polyps. No family history of uterine/endometrial cancer, pancreatic cancer or gastric/stomach cancer.   Past Medical History:  Diagnosis Date   Cancer [facility]) 2007   rt breast CA treated with L/Sn/Chemo/R in 2007   Special screening for malignant neoplasms, colon 2007   rt breast CA treated with L/Sn/Chemo/R in 2007    Past Surgical History:  Procedure Laterality Date   BREAST BIOPSY  2006   BREAST LUMPECTOMY Right 2007   COLONOSCOPY  2007   [facility]   SEPTOPLASTY     wisdom teeth removal       No current outpatient medications on file.   Current Facility-Administered Medications  Medication Dose Route Frequency Provider Last Rate Last Admin   0.9 %  sodium chloride infusion  500 mL Intravenous Continuous Thornton Park, MD        Allergies as of 01/20/2022   (No Known Allergies)    Family History  Problem Relation Age of Onset   Colon polyps Brother    Colon cancer Brother 53   Esophageal cancer Neg Hx    Stomach cancer Neg Hx    Rectal cancer Neg Hx      Physical Exam: General:   Alert,  well-nourished, pleasant and cooperative in NAD Head:  Normocephalic and atraumatic. Eyes:  Sclera clear, no icterus.   Conjunctiva pink. Mouth:  No deformity or lesions.   Neck:  Supple; no masses or thyromegaly. Lungs:  Clear throughout to auscultation.   No wheezes. Heart:  Regular rate and rhythm; no murmurs. Abdomen:  Soft, non-tender, nondistended, normal bowel sounds, no  rebound or guarding.  Msk:  Symmetrical. No boney deformities LAD: No inguinal or umbilical LAD Extremities:  No clubbing or edema. Neurologic:  Alert and  oriented x4;  grossly nonfocal Skin:  No obvious rash or bruise. Psych:  Alert and cooperative. Normal mood and affect.     Studies/Results: No results found.    Rollie Hynek L. Tarri Glenn, MD, MPH 01/20/2022, 9:42 AM

## 2022-01-20 NOTE — Progress Notes (Signed)
Called to room to assist during endoscopic procedure.  Patient ID and intended procedure confirmed with present staff. Received instructions for my participation in the procedure from the performing physician.  

## 2022-01-23 ENCOUNTER — Telehealth: Payer: Self-pay | Admitting: *Deleted

## 2022-01-23 NOTE — Telephone Encounter (Signed)
Post procedure follow up call placed, no answer and left VM.  

## 2022-01-24 ENCOUNTER — Encounter: Payer: Self-pay | Admitting: Gastroenterology
